# Patient Record
Sex: Male | Born: 2000 | Hispanic: No | Marital: Single | Smoking: Never smoker
Health system: Southern US, Community
[De-identification: ages and names within clinical notes are randomized; demographics above are authoritative.]

## PROBLEM LIST (undated history)

## (undated) ENCOUNTER — Emergency Department (HOSPITAL_COMMUNITY): Payer: Medicaid Other

## (undated) DIAGNOSIS — J45909 Unspecified asthma, uncomplicated: Secondary | ICD-10-CM

---

## 2018-09-22 ENCOUNTER — Emergency Department (HOSPITAL_COMMUNITY)
Admission: EM | Admit: 2018-09-22 | Discharge: 2018-09-22 | Disposition: A | Discharge status: Home / Self Care | Payer: Medicaid Other | Attending: Emergency Medicine | Admitting: Emergency Medicine

## 2018-09-22 ENCOUNTER — Other Ambulatory Visit: Payer: Self-pay

## 2018-09-22 ENCOUNTER — Encounter (HOSPITAL_COMMUNITY): Payer: Self-pay

## 2018-09-22 ENCOUNTER — Emergency Department (HOSPITAL_COMMUNITY): Payer: Medicaid Other

## 2018-09-22 DIAGNOSIS — J45909 Unspecified asthma, uncomplicated: Secondary | ICD-10-CM | POA: Diagnosis not present

## 2018-09-22 DIAGNOSIS — R079 Chest pain, unspecified: Secondary | ICD-10-CM

## 2018-09-22 HISTORY — DX: Unspecified asthma, uncomplicated: J45.909

## 2018-09-22 LAB — TROPONIN I (HIGH SENSITIVITY): Troponin I (High Sensitivity): <2 ng/L (ref <18)

## 2018-09-22 LAB — CBC
HCT: 43.1 % (ref 39.0–52.0)
Hemoglobin: 14.4 g/dL (ref 13.0–17.0)
MCH: 27.5 pg (ref 26.0–34.0)
MCHC: 33.4 g/dL (ref 30.0–36.0)
MCV: 82.3 fL (ref 80.0–100.0)
Platelets: 284 10*3/uL (ref 150–400)
RBC: 5.24 MIL/uL (ref 4.22–5.81)
RDW: 12 % (ref 11.5–15.5)
WBC: 5.6 10*3/uL (ref 4.0–10.5)
nRBC: 0 % (ref 0.0–0.2)

## 2018-09-22 LAB — BASIC METABOLIC PANEL
Anion gap: 9 (ref 5–15)
BUN: 13 mg/dL (ref 6–20)
CO2: 26 mmol/L (ref 22–32)
Calcium: 9.4 mg/dL (ref 8.9–10.3)
Chloride: 104 mmol/L (ref 98–111)
Creatinine, Ser: 0.91 mg/dL (ref 0.61–1.24)
GFR calc Af Amer: >60 mL/min (ref >60)
GFR calc non Af Amer: >60 mL/min (ref >60)
Glucose, Bld: 82 mg/dL (ref 70–99)
Potassium: 4.1 mmol/L (ref 3.5–5.1)
Sodium: 139 mmol/L (ref 135–145)

## 2018-09-22 MED ORDER — SODIUM CHLORIDE 0.9% FLUSH: 3.0000 mL | Freq: Once | INTRAVENOUS | Status: DC

## 2018-09-22 NOTE — Discharge Instructions (Addendum)
Please read attached information. If you experience any new or worsening signs or symptoms please return to the emergency room for evaluation. Please follow-up with your primary care provider or specialist as discussed.  °

## 2018-09-22 NOTE — ED Triage Notes (Signed)
Left sided chest pain that is sharp started last night. Pt states it mostly hurts his chest when he inhales, but sometimes when he exhales. The radiates to the left side of his bottom back. Pt states pain is 8/10. Pt has had migraine since last night. Pt is nsr on monitor. Vs wnl.

## 2018-09-22 NOTE — ED Notes (Signed)
Discharge instructions discussed with Pt. Pt verbalized understanding. Pt stable and ambulatory.    

## 2018-09-22 NOTE — ED Provider Notes (Signed)
[facility] Note   CSN: [id number] Arrival date & time: 09/22/18  1129    History   Chief Complaint Chief Complaint  Patient presents with  . Chest Pain    HPI Gregory Morrison is a 18 y.o. male.     HPI   18 year old male presents today with complaints of chest pain.  He notes he has had intermittent chest pain over the last several days.  He notes he has had this before in the past but did not seek evaluation for this.  He was actually seeing his primary care provider yesterday for foot pain and mention this to her.  She recommended follow-up.  The emergency room today.  Notes the pain is sharp in nature is intermittent and nonpersistent.  He notes sometimes it is worse with deep inspiration and laying down.  He notes that he often does not have symptoms, he is able to exercise without discomfort or pain.  He denies any associated shortness of breath, fever or cough.  He denies any lower extremity swelling or edema, history DVT or PE or any other significant risk factors.  Reports he is a non-smoker.  He notes his mother has a history of heart problems but has never had a heart attack, he denies any personal cardiac or pulmonary histories.  He does not use any drugs.     Past Medical History:  Diagnosis Date  . Asthma     There are no active problems to display for this patient.   Past Surgical History:  Procedure Laterality Date  . TONSILLECTOMY          Home Medications    Prior to Admission medications   Not on File    Family History No family history on file.  Social History Social History   Tobacco Use  . Smoking status: Never Smoker  . Smokeless tobacco: Never Used  Substance Use Topics  . Alcohol use: Never    Frequency: Never  . Drug use: Never     Allergies   Patient has no known allergies.   Review of Systems Review of Systems  All other systems reviewed and are negative.     Physical Exam Updated Vital Signs BP 123/87   Pulse 74   Temp 98.4 F (36.9 C) (Oral)   Resp 17   Ht 5\' 9"  (1.753 m)   Wt 6.35 kg   SpO2 100%   BMI 2.07 kg/m   Physical Exam Vitals signs and nursing note reviewed.  Constitutional:      Appearance: He is well-developed.  HENT:     Head: Normocephalic and atraumatic.  Eyes:     General: No scleral icterus.       Right eye: No discharge.        Left eye: No discharge.     Conjunctiva/sclera: Conjunctivae normal.     Pupils: Pupils are equal, round, and reactive to light.  Neck:     Musculoskeletal: Normal range of motion.     Vascular: No JVD.     Trachea: No tracheal deviation.  Pulmonary:     Effort: Pulmonary effort is normal.     Breath sounds: No stridor.  Neurological:     Mental Status: He is alert and oriented to person, place, and time.     Coordination: Coordination normal.  Psychiatric:        Behavior: Behavior normal.        Thought Content: Thought content  normal.        Judgment: Judgment normal.      ED Treatments / Results  Labs (all labs ordered are listed, but only abnormal results are displayed) Labs Reviewed  BASIC METABOLIC PANEL  CBC  TROPONIN I (HIGH SENSITIVITY)    EKG None   ED EKG normal sinus rhythm no ST elevation or depression, 69 bpm  Radiology Dg Chest 2 View  Result Date: 09/22/2018 CLINICAL DATA:  Shortness of breath and chest pain EXAM: CHEST - 2 VIEW COMPARISON:  November 24, 2017. FINDINGS: Lungs are clear. The heart size and pulmonary vascularity are normal. No adenopathy. No pneumothorax. No bone lesions. IMPRESSION: No edema or consolidation. Electronically Signed   By: Lowella Grip III M.D.   On: 09/22/2018 14:18    Procedures Procedures (including critical care time)  Medications Ordered in ED Medications  sodium chloride flush (NS) 0.9 % injection 3 mL (has no administration in time range)     Initial Impression / Assessment and Plan / ED Course  I  have reviewed the triage vital signs and the nursing notes.  Pertinent labs & imaging results that were available during my care of the patient were reviewed by me and considered in my medical decision making (see chart for details).        Labs: BMP, CBC, troponin  Imaging: DG chest 2 view, ED EKG  Consults:  Therapeutics:  Discharge Meds:   Assessment/Plan: 18 year old male presents today with chest pain.  I have very low suspicion for acute life-threatening etiology including ACS, dissection, PE, infectious etiology.  His work-up is reassuring with negative troponin during EKG.  He has no significant risk factors for pulmonary embolism, he is PERC negative and Wells of 0.  He also has no signs or symptoms consistent with ACS.  Question musculoskeletal chest pain.  Patient will be discharged with encouragement to use ibuprofen as needed, return immediately if develops any new or worsening signs or symptoms.  And follow-up with his primary care if he continues to have recurrence of symptoms.  He verbalized understanding and agreement to today's plan had no further questions or concerns at the time of discharge.   Final Clinical Impressions(s) / ED Diagnoses   Final diagnoses:  Nonspecific chest pain    ED Discharge Orders    None       Francee Gentile 09/22/18 1437    Quintella Reichert, MD 09/23/18 [phone]

## 2019-01-30 ENCOUNTER — Encounter: Payer: Self-pay | Admitting: Cardiology

## 2019-01-30 ENCOUNTER — Other Ambulatory Visit: Payer: Self-pay

## 2019-01-30 ENCOUNTER — Ambulatory Visit (INDEPENDENT_AMBULATORY_CARE_PROVIDER_SITE_OTHER): Payer: Medicaid Other | Admitting: Cardiology

## 2019-01-30 VITALS — BP 104/64 | HR 62 | Ht 70.0 in | Wt 144.0 lb

## 2019-01-30 DIAGNOSIS — R0789 Other chest pain: Secondary | ICD-10-CM

## 2019-01-30 DIAGNOSIS — R079 Chest pain, unspecified: Secondary | ICD-10-CM | POA: Diagnosis not present

## 2019-01-30 DIAGNOSIS — Z1329 Encounter for screening for other suspected endocrine disorder: Secondary | ICD-10-CM

## 2019-01-30 DIAGNOSIS — R002 Palpitations: Secondary | ICD-10-CM

## 2019-01-30 DIAGNOSIS — I10 Essential (primary) hypertension: Secondary | ICD-10-CM | POA: Diagnosis not present

## 2019-01-30 HISTORY — DX: Other chest pain: R07.89

## 2019-01-30 HISTORY — DX: Palpitations: R00.2

## 2019-01-30 LAB — TSH: TSH: 1.98 u[IU]/mL (ref 0.450–4.500)

## 2019-01-30 NOTE — Progress Notes (Signed)
Cardiology Office Note:    Date:  01/30/2019   ID:  Gregory Morrison, DOB June 20, 2000, MRN [id number]  PCP:  Marnette Burgess, MD  Cardiologist:  Jenean Lindau, MD   Referring MD: No ref. provider found    ASSESSMENT:    1. Chest pain, unspecified type   2. Essential hypertension   3. Palpitations   4. Chest discomfort    PLAN:    In order of problems listed above:  1. Chest discomfort: This was atypical.  He is a very active gentleman.  With activity he denies any significant issues.  He is sexually active  and with sexual activity has not brought about any chest discomfort-like symptoms. 2. Palpitations: He will undergo 2-week ZIO monitor and 2 wk zio evaluation. 3. Echocardiogram will be done to assess murmur heard on auscultation. 4. Patient will be seen in follow-up appointment in 3 months or earlier if the patient has any concerns    Medication Adjustments/Labs and Tests Ordered: Current medicines are reviewed at length with the patient today.  Concerns regarding medicines are outlined above.  Orders Placed This Encounter  Procedures  . LONG TERM MONITOR (3-14 DAYS)  . EKG 12-Lead  . ECHOCARDIOGRAM COMPLETE   No orders of the defined types were placed in this encounter.    History of Present Illness:    Gregory Morrison is a 18 y.o. male who is being seen today for the evaluation of chest discomfort and palpitations.  Patient is a pleasant 18 year old male.  He has no significant past medical history.  He went to the emergency room with some chest discomfort-like symptoms he was evaluated treated and released.  Subsequently is done fine.  His EKG reveals sinus rhythm that we did today.  He denies any dizziness or any syncopal spells.  He is overall in good health.  He has been an active gentleman.  At the time of my evaluation, the patient is alert awake oriented and in no distress.  Past Medical History:  Diagnosis Date  . Asthma     Past Surgical  History:  Procedure Laterality Date  . TONSILLECTOMY      Current Medications: No outpatient medications have been marked as taking for the 01/30/19 encounter (Office Visit) with Marc Leichter, Reita Cliche, MD.     Allergies:   Patient has no known allergies.   Social History   Socioeconomic History  . Marital status: Single    Spouse name: Not on file  . Number of children: Not on file  . Years of education: Not on file  . Highest education level: Not on file  Occupational History  . Not on file  Social Needs  . Financial resource strain: Not on file  . Food insecurity    Worry: Not on file    Inability: Not on file  . Transportation needs    Medical: Not on file    Non-medical: Not on file  Tobacco Use  . Smoking status: Never Smoker  . Smokeless tobacco: Never Used  Substance and Sexual Activity  . Alcohol use: Never    Frequency: Never  . Drug use: Never  . Sexual activity: Not on file  Lifestyle  . Physical activity    Days per week: Not on file    Minutes per session: Not on file  . Stress: Not on file  Relationships  . Social Herbalist on phone: Not on file    Gets together: Not on file  Attends religious service: Not on file    Active member of club or organization: Not on file    Attends meetings of clubs or organizations: Not on file    Relationship status: Not on file  Other Topics Concern  . Not on file  Social History Narrative  . Not on file     Family History: The patient's family history includes Dementia in his maternal grandfather; Diabetes in his father and mother; Hypertension in his father and mother.  ROS:   Please see the history of present illness.    All other systems reviewed and are negative.  EKGs/Labs/Other Studies Reviewed:    The following studies were reviewed today: EKG reveals sinus rhythm and nonspecific ST-T changes.   Recent Labs: 09/22/2018: BUN 13; Creatinine, Ser 0.91; Hemoglobin 14.4; Platelets 284;  Potassium 4.1; Sodium 139  Recent Lipid Panel No results found for: CHOL, TRIG, HDL, CHOLHDL, VLDL, LDLCALC, LDLDIRECT  Physical Exam:    VS:  BP 104/64 (BP Location: Left Arm, Patient Position: Sitting, Cuff Size: Normal)   Pulse 62   Ht 5\' 10"  (1.778 m)   Wt 144 lb (65.3 kg)   SpO2 98%   BMI 20.66 kg/m     Wt Readings from Last 3 Encounters:  01/30/19 144 lb (65.3 kg)  09/22/18 14 lb (6.35 kg) (<1 %, Z= -56.12)*   * Growth percentiles are based on CDC (Boys, 2-20 Years) data.     GEN: Patient is in no acute distress HEENT: Normal NECK: No JVD; No carotid bruits LYMPHATICS: No lymphadenopathy CARDIAC: S1 S2 regular, 2/6 systolic murmur at the apex. RESPIRATORY:  Clear to auscultation without rales, wheezing or rhonchi  ABDOMEN: Soft, non-tender, non-distended MUSCULOSKELETAL:  No edema; No deformity  SKIN: Warm and dry NEUROLOGIC:  Alert and oriented x 3 PSYCHIATRIC:  Normal affect    Signed, 09/24/18, MD  01/30/2019 10:29 AM    [facility]

## 2019-01-30 NOTE — Patient Instructions (Addendum)
Medication Instructions:  Your physician recommends that you continue on your current medications as directed. Please refer to the Current Medication list given to you today.  *If you need a refill on your cardiac medications before your next appointment, please call your pharmacy*  Lab Work: Your physician recommends that you have a TSH drawn today If you have labs (blood work) drawn today and your tests are completely normal, you will receive your results only by: Marland Kitchen MyChart Message (if you have MyChart) OR . A paper copy in the mail If you have any lab test that is abnormal or we need to change your treatment, we will call you to review the results.  Testing/Procedures: You had an EKG performed today  Your physician has requested that you have an echocardiogram. Echocardiography is a painless test that uses sound waves to create images of your heart. It provides your doctor with information about the size and shape of your heart and how well your heart's chambers and valves are working. This procedure takes approximately one hour. There are no restrictions for this procedure.  Your physician has recommended that you wear a ZIO monitor. ZIO monitors are medical devices that record the heart's electrical activity. Doctors most often use these monitors to diagnose arrhythmias. Arrhythmias are problems with the speed or rhythm of the heartbeat. The monitor is a small, portable device. You can wear one while you do your normal daily activities. This is usually used to diagnose what is causing palpitations/syncope (passing out).You will wear this device for 2 weeks    Follow-Up: At [facility], you and your health needs are our priority.  As part of our continuing mission to provide you with exceptional heart care, we have created designated Provider Care Teams.  These Care Teams include your primary Cardiologist (physician) and Advanced Practice Providers (APPs -  Physician Assistants and Nurse  Practitioners) who all work together to provide you with the care you need, when you need it.  Your next appointment:   3 months  The format for your next appointment:   In Person  Provider:   Belva Crome, MD  Other Instructions  Echocardiogram An echocardiogram is a procedure that uses painless sound waves (ultrasound) to produce an image of the heart. Images from an echocardiogram can provide important information about:  Signs of coronary artery disease (CAD).  Aneurysm detection. An aneurysm is a weak or damaged part of an artery wall that bulges out from the normal force of blood pumping through the body.  Heart size and shape. Changes in the size or shape of the heart can be associated with certain conditions, including heart failure, aneurysm, and CAD.  Heart muscle function.  Heart valve function.  Signs of a past heart attack.  Fluid buildup around the heart.  Thickening of the heart muscle.  A tumor or infectious growth around the heart valves. Tell a health care provider about:  Any allergies you have.  All medicines you are taking, including vitamins, herbs, eye drops, creams, and over-the-counter medicines.  Any blood disorders you have.  Any surgeries you have had.  Any medical conditions you have.  Whether you are pregnant or may be pregnant. What are the risks? Generally, this is a safe procedure. However, problems may occur, including:  Allergic reaction to dye (contrast) that may be used during the procedure. What happens before the procedure? No specific preparation is needed. You may eat and drink normally. What happens during the procedure?  An IV tube may be inserted into one of your veins.  You may receive contrast through this tube. A contrast is an injection that improves the quality of the pictures from your heart.  A gel will be applied to your chest.  A wand-like tool (transducer) will be moved over your chest. The gel will  help to transmit the sound waves from the transducer.  The sound waves will harmlessly bounce off of your heart to allow the heart images to be captured in real-time motion. The images will be recorded on a computer. The procedure may vary among health care providers and hospitals. What happens after the procedure?  You may return to your normal, everyday life, including diet, activities, and medicines, unless your health care provider tells you not to do that. Summary  An echocardiogram is a procedure that uses painless sound waves (ultrasound) to produce an image of the heart.  Images from an echocardiogram can provide important information about the size and shape of your heart, heart muscle function, heart valve function, and fluid buildup around your heart.  You do not need to do anything to prepare before this procedure. You may eat and drink normally.  After the echocardiogram is completed, you may return to your normal, everyday life, unless your health care provider tells you not to do that. This information is not intended to replace advice given to you by your health care provider. Make sure you discuss any questions you have with your health care provider. Document Released: 03/13/2000 Document Revised: 07/07/2018 Document Reviewed: 04/18/2016 Elsevier Patient Education  2020 [employer].

## 2019-02-01 ENCOUNTER — Telehealth: Payer: Self-pay

## 2019-02-01 NOTE — Telephone Encounter (Signed)
Results relayed, copy sent to Dr. Beckie Salts

## 2019-02-01 NOTE — Telephone Encounter (Signed)
-----   Message from Jenean Lindau, MD sent at 01/30/2019  4:52 PM EST ----- The results of the study is unremarkable. Please inform patient. I will discuss in detail at next appointment. Cc  primary care/referring physician Jenean Lindau, MD 01/30/2019 4:52 PM

## 2019-03-14 ENCOUNTER — Other Ambulatory Visit: Payer: Medicaid Other

## 2019-05-08 ENCOUNTER — Ambulatory Visit (INDEPENDENT_AMBULATORY_CARE_PROVIDER_SITE_OTHER): Payer: Medicaid Other | Admitting: Cardiology

## 2019-05-08 ENCOUNTER — Encounter: Payer: Self-pay | Admitting: Cardiology

## 2019-05-08 ENCOUNTER — Other Ambulatory Visit: Payer: Self-pay

## 2019-05-08 ENCOUNTER — Ambulatory Visit (INDEPENDENT_AMBULATORY_CARE_PROVIDER_SITE_OTHER): Payer: Medicaid Other

## 2019-05-08 VITALS — BP 108/62 | HR 88 | Ht 70.0 in | Wt 139.0 lb

## 2019-05-08 DIAGNOSIS — R002 Palpitations: Secondary | ICD-10-CM

## 2019-05-08 DIAGNOSIS — R079 Chest pain, unspecified: Secondary | ICD-10-CM

## 2019-05-08 NOTE — Patient Instructions (Signed)
Medication Instructions:  No medication changes *If you need a refill on your cardiac medications before your next appointment, please call your pharmacy*  Lab Work: None ordered If you have labs (blood work) drawn today and your tests are completely normal, you will receive your results only by: . MyChart Message (if you have MyChart) OR . A paper copy in the mail If you have any lab test that is abnormal or we need to change your treatment, we will call you to review the results.  Testing/Procedures: None ordered  Follow-Up: At [facility], you and your health needs are our priority.  As part of our continuing mission to provide you with exceptional heart care, we have created designated Provider Care Teams.  These Care Teams include your primary Cardiologist (physician) and Advanced Practice Providers (APPs -  Physician Assistants and Nurse Practitioners) who all work together to provide you with the care you need, when you need it.  Your next appointment:   3 month(s)  The format for your next appointment:   In Person  Provider:   Rajan Revankar, MD  Other Instructions NA  

## 2019-05-08 NOTE — Progress Notes (Signed)
Cardiology Office Note:    Date:  05/08/2019   ID:  Gregory Morrison, DOB [birth year], MRN [id number]  PCP:  [facility]  Cardiologist:  Garwin Brothers, MD   Referring MD: Eula Fried, MD    ASSESSMENT:    1. Palpitations    PLAN:    In order of problems listed above:  1. Palpitations: These have resolved for most part.  We are awaiting results of the monitoring. 2. Echocardiogram was within normal limits and I discussed this with the patient.  His chest discomfort has resolved asked him to start exercising some on a regular basis in a graded fashion and he promises to do so.  I told him to walk about half an hour daily and see how he feels and get back to [place] about it. Patient will be seen in follow-up appointment in 3 months or earlier if the patient has any concerns    Medication Adjustments/Labs and Tests Ordered: Current medicines are reviewed at length with the patient today.  Concerns regarding medicines are outlined above.  No orders of the defined types were placed in this encounter.  No orders of the defined types were placed in this encounter.    Chief Complaint  Patient presents with  . Follow-up    3 Months     History of Present Illness:    Gregory Morrison is a 19 y.o. male.  Patient was evaluated by me for palpitations and some chest discomfort.  He mentions to me that his chest discomfort has completely resolved.  He denies any chest pain orthopnea or PND.  He is palpitations have also resolved.  He experiences them very occasionally.  He has returned his event monitor and event monitor company mentioned to him that they have not received it.  We are going to look into this.  Past Medical History:  Diagnosis Date  . Asthma     Past Surgical History:  Procedure Laterality Date  . TONSILLECTOMY      Current Medications: Current Meds  Medication Sig  . Hyoscyamine Sulfate (LEVSIN PO) Take by mouth.     Allergies:   Patient has no known allergies.   Social History   Socioeconomic History  . Marital status: Single    Spouse name: Not on file  . Number of children: Not on file  . Years of education: Not on file  . Highest education level: Not on file  Occupational History  . Not on file  Tobacco Use  . Smoking status: Never Smoker  . Smokeless tobacco: Never Used  Substance and Sexual Activity  . Alcohol use: Never  . Drug use: Never  . Sexual activity: Not on file  Other Topics Concern  . Not on file  Social History Narrative  . Not on file   Social Determinants of Health   Financial Resource Strain:   . Difficulty of Paying Living Expenses: Not on file  Food Insecurity:   . Worried About Programme researcher, broadcasting/film/video in the Last Year: Not on file  . Ran Out of Food in the Last Year: Not on file  Transportation Needs:   . Lack of Transportation (Medical): Not on file  . Lack of Transportation (Non-Medical): Not on file  Physical Activity:   . Days of Exercise per Week: Not on file  . Minutes of Exercise per Session: Not on file  Stress:   . Feeling of Stress : Not on file  Social Connections:   . Frequency of Communication with Friends and Family: Not on file  . Frequency of Social Gatherings with Friends and Family: Not on file  . Attends Religious Services: Not on file  . Active Member of Clubs or Organizations: Not on file  . Attends Archivist Meetings: Not on file  . Marital Status: Not on file     Family History: The patient's family history includes Dementia in his maternal grandfather; Diabetes in his father and mother; Hypertension in his father and mother.  ROS:   Please see the history of present illness.    All other systems reviewed and are negative.  EKGs/Labs/Other Studies Reviewed:    The following studies were reviewed today: Echocardiogram revealed normal systolic function and was otherwise unremarkable study   Recent  Labs: 09/22/2018: BUN 13; Creatinine, Ser 0.91; Hemoglobin 14.4; Platelets 284; Potassium 4.1; Sodium 139 01/30/2019: TSH 1.980  Recent Lipid Panel No results found for: CHOL, TRIG, HDL, CHOLHDL, VLDL, LDLCALC, LDLDIRECT  Physical Exam:    VS:  BP 108/62   Pulse 88   Ht 5\' 10"  (1.778 m)   Wt 139 lb (63 kg)   SpO2 98%   BMI 19.94 kg/m     Wt Readings from Last 3 Encounters:  05/08/19 139 lb (63 kg)  01/30/19 144 lb (65.3 kg)  09/22/18 14 lb (6.35 kg) (<1 %, Z= -56.12)*   * Growth percentiles are based on CDC (Boys, 2-20 Years) data.     GEN: Patient is in no acute distress HEENT: Normal NECK: No JVD; No carotid bruits LYMPHATICS: No lymphadenopathy CARDIAC: Hear sounds regular, 2/6 systolic murmur at the apex. RESPIRATORY:  Clear to auscultation without rales, wheezing or rhonchi  ABDOMEN: Soft, non-tender, non-distended MUSCULOSKELETAL:  No edema; No deformity  SKIN: Warm and dry NEUROLOGIC:  Alert and oriented x 3 PSYCHIATRIC:  Normal affect   Signed, Jenean Lindau, MD  05/08/2019 2:42 PM    [facility]

## 2019-05-08 NOTE — Progress Notes (Signed)
Complete echocardiogram has been performed.  Jimmy Jameila Keeny RDCS, RVT 

## 2019-06-22 ENCOUNTER — Emergency Department (HOSPITAL_COMMUNITY)
Admission: EM | Admit: 2019-06-22 | Discharge: 2019-06-22 | Discharge status: Against Medical Advice | Payer: Medicaid Other

## 2019-06-22 NOTE — ED Notes (Signed)
Called for triage, no answer

## 2019-08-03 ENCOUNTER — Other Ambulatory Visit: Payer: Self-pay

## 2019-08-07 ENCOUNTER — Ambulatory Visit: Payer: Medicaid Other | Admitting: Cardiology

## 2019-08-15 ENCOUNTER — Ambulatory Visit (INDEPENDENT_AMBULATORY_CARE_PROVIDER_SITE_OTHER): Payer: Medicaid Other | Admitting: Cardiology

## 2019-08-15 ENCOUNTER — Other Ambulatory Visit: Payer: Self-pay

## 2019-08-15 ENCOUNTER — Encounter: Payer: Self-pay | Admitting: Cardiology

## 2019-08-15 VITALS — BP 108/62 | HR 88 | Ht 70.0 in | Wt 141.0 lb

## 2019-08-15 DIAGNOSIS — R002 Palpitations: Secondary | ICD-10-CM

## 2019-08-15 NOTE — Progress Notes (Signed)
Cardiology Office Note:    Date:  08/15/2019   ID:  Gregory Morrison, DOB [birth year], MRN [id number]  PCP:  [facility]  Cardiologist:  Garwin Brothers, MD   Referring MD: [facility]    ASSESSMENT:    1. Palpitations    PLAN:    In order of problems listed above:  1. Palpitations: I reassured the patient about my findings.  He has not had any serious symptoms such as syncope or dizziness from these.  He is concerned about it.  TSH done in November was unremarkable so was echocardiogram done in the past and I reviewed this with him at length.  I reassured him.  I will do a 2-week event monitor to understand his symptoms.  He is agreeable for this.  He knows to go to nearest emergency room for any concerning symptoms.Patient will be seen in follow-up appointment in 2 months or earlier if the patient has any concerns    Medication Adjustments/Labs and Tests Ordered: Current medicines are reviewed at length with the patient today.  Concerns regarding medicines are outlined above.  No orders of the defined types were placed in this encounter.  No orders of the defined types were placed in this encounter.    Chief Complaint  Patient presents with  . Follow-up     History of Present Illness:    Gregory Morrison is a 19 y.o. male.  Patient has been evaluated in the past for palpitations.  He denies any problems at this time except occasional palpitations which happen 3-4 times a week.  No orthopnea PND dizziness or any syncope.  At the time of my evaluation, the patient is alert awake oriented and in no distress.  Past Medical History:  Diagnosis Date  . Asthma   . Chest discomfort 01/30/2019  . Palpitations 01/30/2019    Past Surgical History:  Procedure Laterality Date  . TONSILLECTOMY      Current Medications: No outpatient medications have been marked as taking for the 08/15/19 encounter (Office Visit) with Lachae Hohler,  Aundra Dubin, MD.     Allergies:   Patient has no known allergies.   Social History   Socioeconomic History  . Marital status: Single    Spouse name: Not on file  . Number of children: Not on file  . Years of education: Not on file  . Highest education level: Not on file  Occupational History  . Not on file  Tobacco Use  . Smoking status: Never Smoker  . Smokeless tobacco: Never Used  Substance and Sexual Activity  . Alcohol use: Never  . Drug use: Never  . Sexual activity: Not on file  Other Topics Concern  . Not on file  Social History Narrative  . Not on file   Social Determinants of Health   Financial Resource Strain:   . Difficulty of Paying Living Expenses:   Food Insecurity:   . Worried About Programme researcher, broadcasting/film/video in the Last Year:   . Barista in the Last Year:   Transportation Needs:   . Freight forwarder (Medical):   Marland Kitchen Lack of Transportation (Non-Medical):   Physical Activity:   . Days of Exercise per Week:   . Minutes of Exercise per Session:   Stress:   . Feeling of Stress :   Social Connections:   . Frequency of Communication with Friends and Family:   . Frequency of Social Gatherings with  Friends and Family:   . Attends Religious Services:   . Active Member of Clubs or Organizations:   . Attends Archivist Meetings:   Marland Kitchen Marital Status:      Family History: The patient's family history includes Dementia in his maternal grandfather; Diabetes in his father and mother; Hypertension in his father and mother.  ROS:   Please see the history of present illness.    All other systems reviewed and are negative.  EKGs/Labs/Other Studies Reviewed:    The following studies were reviewed today: IMPRESSIONS    1. Left ventricular ejection fraction, by estimation, is 60 to 65%. The  left ventricle has normal function.   Recent Labs: 09/22/2018: BUN 13; Creatinine, Ser 0.91; Hemoglobin 14.4; Platelets 284; Potassium 4.1; Sodium  139 01/30/2019: TSH 1.980  Recent Lipid Panel No results found for: CHOL, TRIG, HDL, CHOLHDL, VLDL, LDLCALC, LDLDIRECT  Physical Exam:    VS:  BP 108/62   Pulse 88   Ht 5\' 10"  (1.778 m)   Wt 141 lb (64 kg)   SpO2 98%   BMI 20.23 kg/m     Wt Readings from Last 3 Encounters:  08/15/19 141 lb (64 kg)  05/08/19 139 lb (63 kg)  01/30/19 144 lb (65.3 kg)     GEN: Patient is in no acute distress HEENT: Normal NECK: No JVD; No carotid bruits LYMPHATICS: No lymphadenopathy CARDIAC: Hear sounds regular, 2/6 systolic murmur at the apex. RESPIRATORY:  Clear to auscultation without rales, wheezing or rhonchi  ABDOMEN: Soft, non-tender, non-distended MUSCULOSKELETAL:  No edema; No deformity  SKIN: Warm and dry NEUROLOGIC:  Alert and oriented x 3 PSYCHIATRIC:  Normal affect   Signed, Jenean Lindau, MD  08/15/2019 3:27 PM    [facility]

## 2019-08-15 NOTE — Patient Instructions (Signed)
Medication Instructions:  No medication changes. *If you need a refill on your cardiac medications before your next appointment, please call your pharmacy*   Lab Work: None ordered If you have labs (blood work) drawn today and your tests are completely normal, you will receive your results only by: Marland Kitchen MyChart Message (if you have MyChart) OR . A paper copy in the mail If you have any lab test that is abnormal or we need to change your treatment, we will call you to review the results.   Testing/Procedures:  WHY IS MY DOCTOR PRESCRIBING ZIO? The Zio system is proven and trusted by physicians to detect and diagnose irregular heart rhythms -- and has been prescribed to hundreds of thousands of patients.  The FDA has cleared the Zio system to monitor for many different kinds of irregular heart rhythms. In a study, physicians were able to reach a diagnosis 90% of the time with the Zio system1.  You can wear the Zio monitor -- a small, discreet, comfortable patch -- during your normal day-to-day activity, including while you sleep, shower, and exercise, while it records every single heartbeat for analysis.  1Barrett, P., et al. Comparison of 24 Hour Holter Monitoring Versus 14 Day Novel Adhesive Patch Electrocardiographic Monitoring. American Journal of Medicine, 2014.  ZIO VS. HOLTER MONITORING The Zio monitor can be comfortably worn for up to 14 days. Holter monitors can be worn for 24 to 48 hours, limiting the time to record any irregular heart rhythms you may have. Zio is able to capture data for the 51% of patients who have their first symptom-triggered arrhythmia after 48 hours.1  LIVE WITHOUT RESTRICTIONS The Zio ambulatory cardiac monitor is a small, unobtrusive, and water-resistant patch--you might even forget you're wearing it. The Zio monitor records and stores every beat of your heart, whether you're sleeping, working out, or showering.  Wear the monitor for 2 weeks,, remove on  08/29/19.   Follow-Up: At [facility], you and your health needs are our priority.  As part of our continuing mission to provide you with exceptional heart care, we have created designated Provider Care Teams.  These Care Teams include your primary Cardiologist (physician) and Advanced Practice Providers (APPs -  Physician Assistants and Nurse Practitioners) who all work together to provide you with the care you need, when you need it.  We recommend signing up for the patient portal called "MyChart".  Sign up information is provided on this After Visit Summary.  MyChart is used to connect with patients for Virtual Visits (Telemedicine).  Patients are able to view lab/test results, encounter notes, upcoming appointments, etc.  Non-urgent messages can be sent to your provider as well.   To learn more about what you can do with MyChart, go to [email or web address].    Your next appointment:   6 month(s)  The format for your next appointment:   In Person  Provider:   Belva Crome, MD   Other Instructions NA

## 2020-06-04 DIAGNOSIS — J45909 Unspecified asthma, uncomplicated: Secondary | ICD-10-CM | POA: Insufficient documentation

## 2020-06-05 ENCOUNTER — Encounter: Payer: Self-pay | Admitting: Cardiology

## 2020-06-05 ENCOUNTER — Ambulatory Visit (INDEPENDENT_AMBULATORY_CARE_PROVIDER_SITE_OTHER): Payer: Medicaid Other | Admitting: Cardiology

## 2020-06-05 ENCOUNTER — Other Ambulatory Visit: Payer: Self-pay

## 2020-06-05 VITALS — BP 124/82 | HR 90 | Ht 69.0 in | Wt 152.6 lb

## 2020-06-05 DIAGNOSIS — R0789 Other chest pain: Secondary | ICD-10-CM | POA: Diagnosis not present

## 2020-06-05 DIAGNOSIS — R002 Palpitations: Secondary | ICD-10-CM

## 2020-06-05 NOTE — Patient Instructions (Signed)
Medication Instructions:  No medication changes. *If you need a refill on your cardiac medications before your next appointment, please call your pharmacy*   Lab Work: None ordered If you have labs (blood work) drawn today and your tests are completely normal, you will receive your results only by: Marland Kitchen MyChart Message (if you have MyChart) OR . A paper copy in the mail If you have any lab test that is abnormal or we need to change your treatment, we will call you to review the results.   Testing/Procedures: Your physician has requested that you have a stress echocardiogram. For further information please visit [email or web address] Please follow instruction sheet as given.  This will be done at [facility].  You must have a COVID test prior to the stress test.   Follow-Up: At [facility], you and your health needs are our priority.  As part of our continuing mission to provide you with exceptional heart care, we have created designated Provider Care Teams.  These Care Teams include your primary Cardiologist (physician) and Advanced Practice Providers (APPs -  Physician Assistants and Nurse Practitioners) who all work together to provide you with the care you need, when you need it.  We recommend signing up for the patient portal called "MyChart".  Sign up information is provided on this After Visit Summary.  MyChart is used to connect with patients for Virtual Visits (Telemedicine).  Patients are able to view lab/test results, encounter notes, upcoming appointments, etc.  Non-urgent messages can be sent to your provider as well.   To learn more about what you can do with MyChart, go to [email or web address].    Your next appointment:   6 month(s)  The format for your next appointment:   In Person  Provider:   Belva Crome, MD   Other Instructions  Exercise Stress Echocardiogram An exercise stress echocardiogram is a test to check how well your heart is working. This  test uses sound waves and a computer to make pictures of your heart. These pictures will be taken before and after you exercise. For this test, you will walk on a treadmill or ride a bicycle to make your heart beat faster. While you exercise, your heart will be checked with an electrocardiogram (ECG). Your blood pressure will also be checked. You may have this test if:  You have chest pain or a heart problem.  You had a heart attack or heart surgery not long ago.  You have heart valve problems.  You have a condition that causes narrowing of the blood vessels that supply your heart.  You have a high risk of heart disease and: ? You are starting a new exercise program. ? You need to have a big surgery. Tell a doctor about:  Any allergies you have.  All medicines you are taking. This includes vitamins, herbs, eye drops, creams, and over-the-counter medicines.  Any problems you or family members have had with medicines that make you fall asleep (anesthetic medicines).  Any surgeries you have had.  Any blood disorders you have.  Any medical conditions you have.  Whether you are pregnant or may be pregnant. What are the risks? Generally, this is a safe test. However, problems may occur, including:  Chest pain.  Feeling dizzy or light-headed.  Shortness of breath.  Increased or irregular heartbeat.  Feeling like you may vomit (nausea) or vomiting.  Heart attack. This is very rare. What happens before the test? Medicines  Ask your doctor about changing or stopping  your normal medicines. This is important if you take diabetes medicines or blood thinners.  If you use an inhaler, bring it to the test. General instructions  Wear comfortable clothes and walking shoes.  Follow instructions from your doctor about what you cannot eat or drink before the test.  Do not drink or eat anything that has caffeine in it. Stop having caffeine 24 hours before the test.  Do not smoke  or use products that contain nicotine or tobacco for 4 hours before the test. If you need help quitting, ask your doctor. What happens during the test?  You will take off your clothes from the waist up and put on a hospital gown.  Electrodes or patches will be put on your chest.  A blood pressure cuff will be put on your arm.  Before you exercise, a computer will make a picture of your heart. To do this: ? You will lie down and a gel will be put on your chest. ? A wand will be moved over the gel. ? Sound waves from the wand will go to the computer to make the picture.  Then, you will start to exercise. You may walk on a treadmill or pedal a bicycle.  Your blood pressure and heart rhythm will be checked while you exercise.  The exercise will get harder or faster.  You will exercise until: ? Your heart reaches a certain level. ? You are too tired to go on. ? You cannot go on because of chest pain, weakness, or dizziness.  You will lie down right away so another picture of your heart can be taken. The procedure may vary among doctors and hospitals.   What can I expect after the test?  After your test, it is common to have: ? Mild soreness. ? Mild tiredness. Your heart rate and blood pressure will be checked until they return to your normal levels. You should not have any new symptoms after this test. Follow these instructions at home:  If your doctor says that you can, you may: ? Eat what you normally eat. ? Do your normal activities.  Take over-the-counter and prescription medicines only as told by your doctor.  Keep all follow-up visits.  It is up to you to get the results of your test. Ask how to get your results when they are ready. Contact a doctor if:  You feel dizzy or light-headed.  You have a fast or irregular heartbeat.  You feel like you may vomit or you vomit.  You have a headache.  You feel short of breath. Get help right away if:  You develop pain  or pressure: ? In your chest. ? In your jaw or neck. ? Between your shoulders. ? That goes down your left arm.  You faint.  You have trouble breathing. These symptoms may be an emergency. Get medical help right away. Call your local emergency services (911 in the [place]  Do not wait to see if the symptoms will go away.  Do not drive yourself to the hospital. Summary  This is a test that checks how well your heart is working.  Follow instructions about what you cannot eat or drink before the test. Ask your doctor if you should take your normal medicines before the test.  Stop having caffeine 24 hours before the test.  Do not smoke or use products with nicotine or tobacco in them for 4 hours before the test.  During the test, your blood pressure and  heart rhythm will be checked while you exercise. This information is not intended to replace advice given to you by your health care provider. Make sure you discuss any questions you have with your health care provider. Document Revised: 11/07/2019 Document Reviewed: 11/07/2019 Elsevier Patient Education  2021 [employer].

## 2020-06-05 NOTE — Progress Notes (Signed)
Cardiology Office Note:    Date:  06/05/2020   ID:  Gregory Morrison, DOB May 04, 2000, MRN [id number]  PCP:  [facility]  Cardiologist:  Garwin Brothers, MD   Referring MD: [facility]    ASSESSMENT:    1. Palpitations   2. Chest discomfort    PLAN:    In order of problems listed above:  1. Primary prevention stressed with the patient.  Importance of compliance with diet medication stressed any vocalized understanding. 2. Chest pain: He exercises on a regular basis 2-3 times a week.  However his symptoms of chest pain are concerning him and he is a little anxious about it.  In view.  We will do an exercise stress echo to evaluate his symptoms and to reassure him.  He is agreeable. 3. Patient will be seen in follow-up appointment in 6 months or earlier if the patient has any concerns.  He knows to go to the nearest emergency room for any concerning symptoms.   Medication Adjustments/Labs and Tests Ordered: Current medicines are reviewed at length with the patient today.  Concerns regarding medicines are outlined above.  Orders Placed This Encounter  Procedures  . EKG 12-Lead  . ECHOCARDIOGRAM STRESS TEST   No orders of the defined types were placed in this encounter.    No chief complaint on file.    History of Present Illness:    Gregory Morrison is a 20 y.o. male.  Patient is here for follow-up.  He mentions to me that his symptoms have subsided.  He has no palpitations.  He is concerned about chest pain.  He mentions to me that he feels some chest tightness and anterior part of the chest.  No orthopnea or PND.  He is concerned about it.  This does not occur with exertion.  This is got him worried.  At the time of my evaluation, the patient is alert awake oriented and in no distress.  Past Medical History:  Diagnosis Date  . Asthma   . Chest discomfort 01/30/2019  . Palpitations 01/30/2019    Past Surgical History:   Procedure Laterality Date  . TONSILLECTOMY      Current Medications: Current Meds  Medication Sig  . meloxicam (MOBIC) 15 MG tablet Take 15 mg by mouth daily.     Allergies:   Patient has no known allergies.   Social History   Socioeconomic History  . Marital status: Single    Spouse name: Not on file  . Number of children: Not on file  . Years of education: Not on file  . Highest education level: Not on file  Occupational History  . Not on file  Tobacco Use  . Smoking status: Never Smoker  . Smokeless tobacco: Never Used  Vaping Use  . Vaping Use: Never used  Substance and Sexual Activity  . Alcohol use: Never  . Drug use: Never  . Sexual activity: Not on file  Other Topics Concern  . Not on file  Social History Narrative  . Not on file   Social Determinants of Health   Financial Resource Strain: Not on file  Food Insecurity: Not on file  Transportation Needs: Not on file  Physical Activity: Not on file  Stress: Not on file  Social Connections: Not on file     Family History: The patient's family history includes Dementia in his maternal grandfather; Diabetes in his father and mother; Hypertension in his father and  mother.  ROS:   Please see the history of present illness.    All other systems reviewed and are negative.  EKGs/Labs/Other Studies Reviewed:    The following studies were reviewed today: IMPRESSIONS    1. Left ventricular ejection fraction, by estimation, is 60 to 65%. The  left ventricle has normal function.    Recent Labs: No results found for requested labs within last 8760 hours.  Recent Lipid Panel No results found for: CHOL, TRIG, HDL, CHOLHDL, VLDL, LDLCALC, LDLDIRECT  Physical Exam:    VS:  BP 124/82   Pulse 90   Ht 5\' 9"  (1.753 m)   Wt 152 lb 9.6 oz (69.2 kg)   SpO2 99%   BMI 22.54 kg/m     Wt Readings from Last 3 Encounters:  06/05/20 152 lb 9.6 oz (69.2 kg)  08/15/19 141 lb (64 kg)  05/08/19 139 lb (63 kg)      GEN: Patient is in no acute distress HEENT: Normal NECK: No JVD; No carotid bruits LYMPHATICS: No lymphadenopathy CARDIAC: Hear sounds regular, 2/6 systolic murmur at the apex. RESPIRATORY:  Clear to auscultation without rales, wheezing or rhonchi  ABDOMEN: Soft, non-tender, non-distended MUSCULOSKELETAL:  No edema; No deformity  SKIN: Warm and dry NEUROLOGIC:  Alert and oriented x 3 PSYCHIATRIC:  Normal affect   Signed, 07/06/19, MD  06/05/2020 4:39 PM    [facility]

## 2020-10-21 IMAGING — DX CHEST - 2 VIEW
2 series · 2 of 2 positions shown · non-contrast
Comparison: November 24, 2017.

CLINICAL DATA: Shortness of breath and chest pain

EXAM:
CHEST - 2 VIEW

[chest pa]
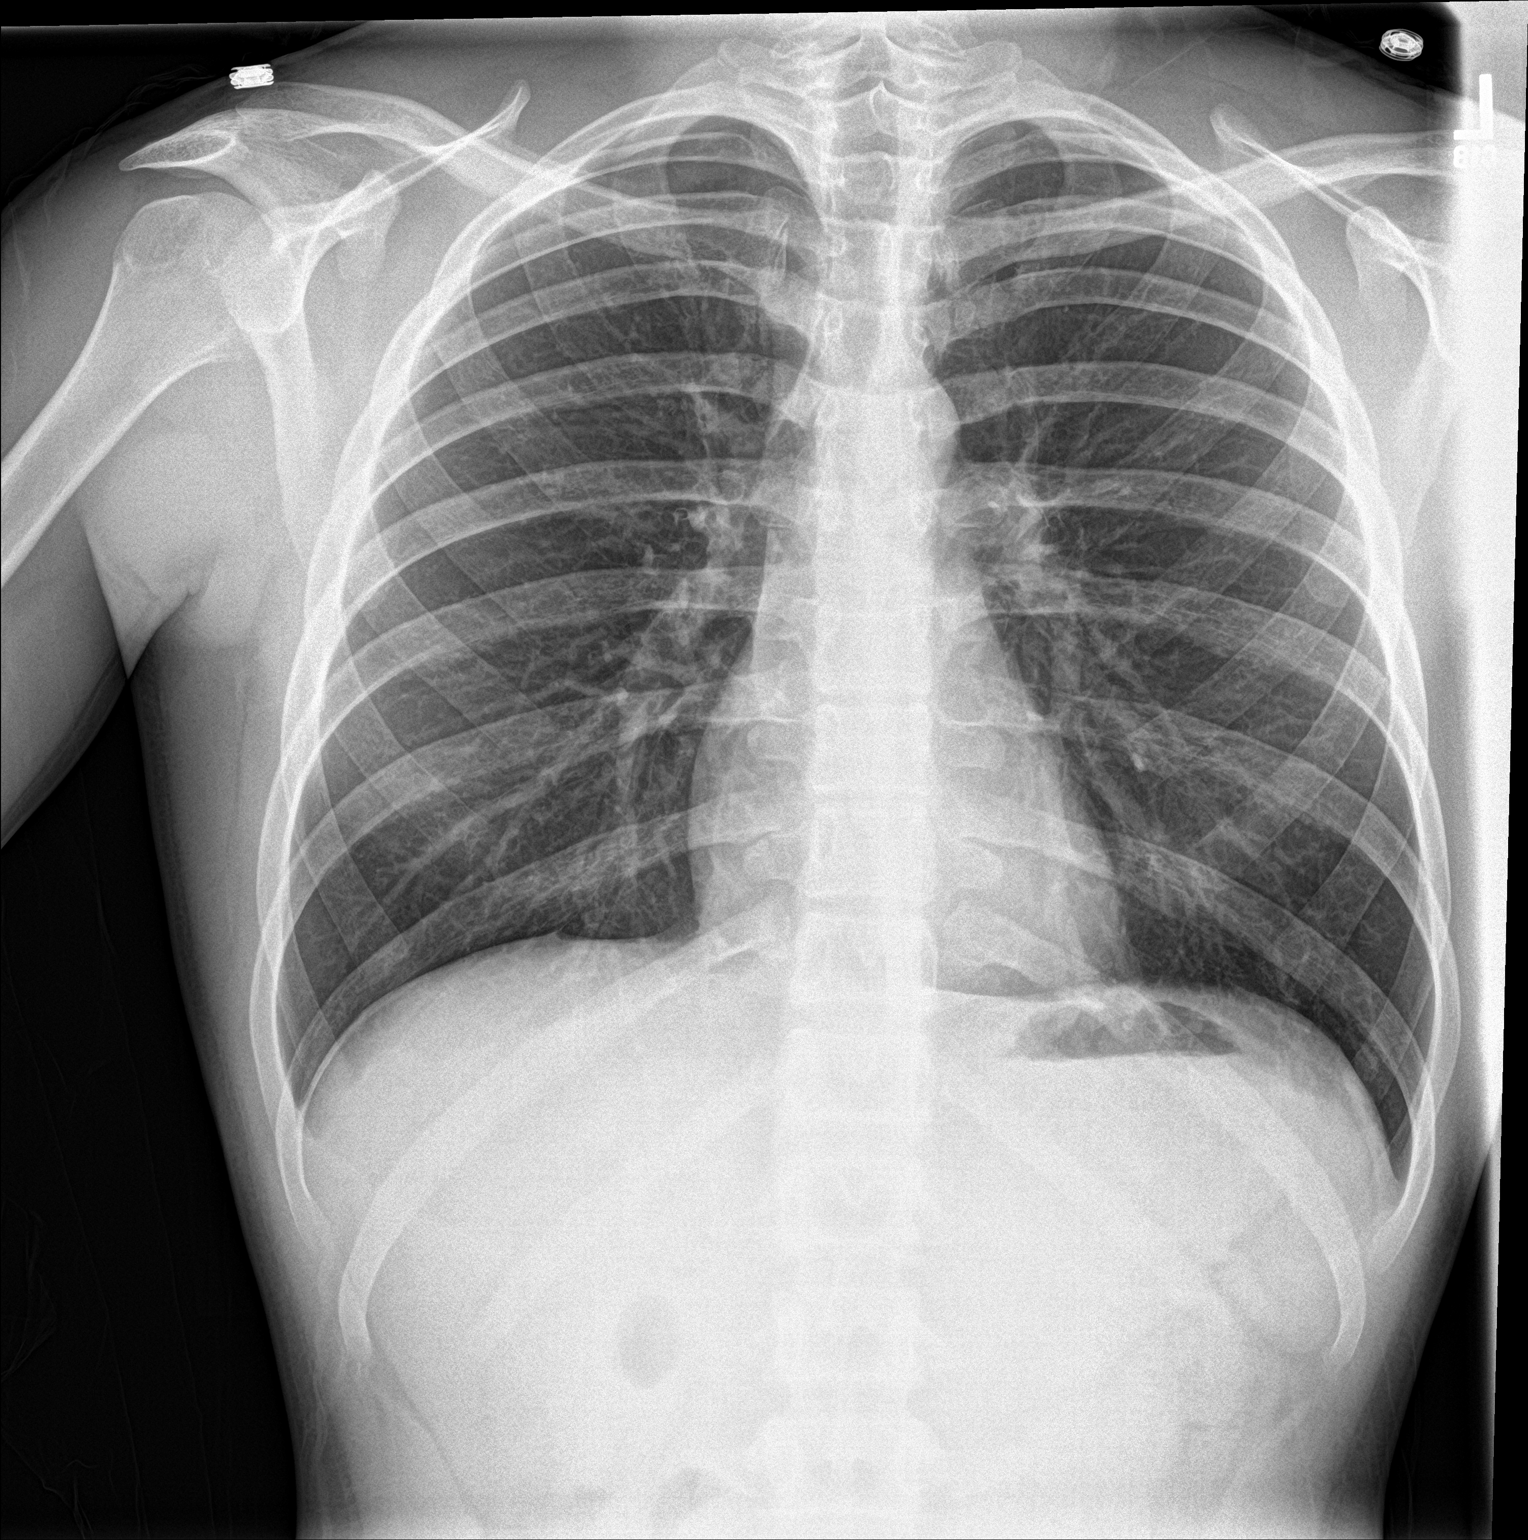

[chest lat]
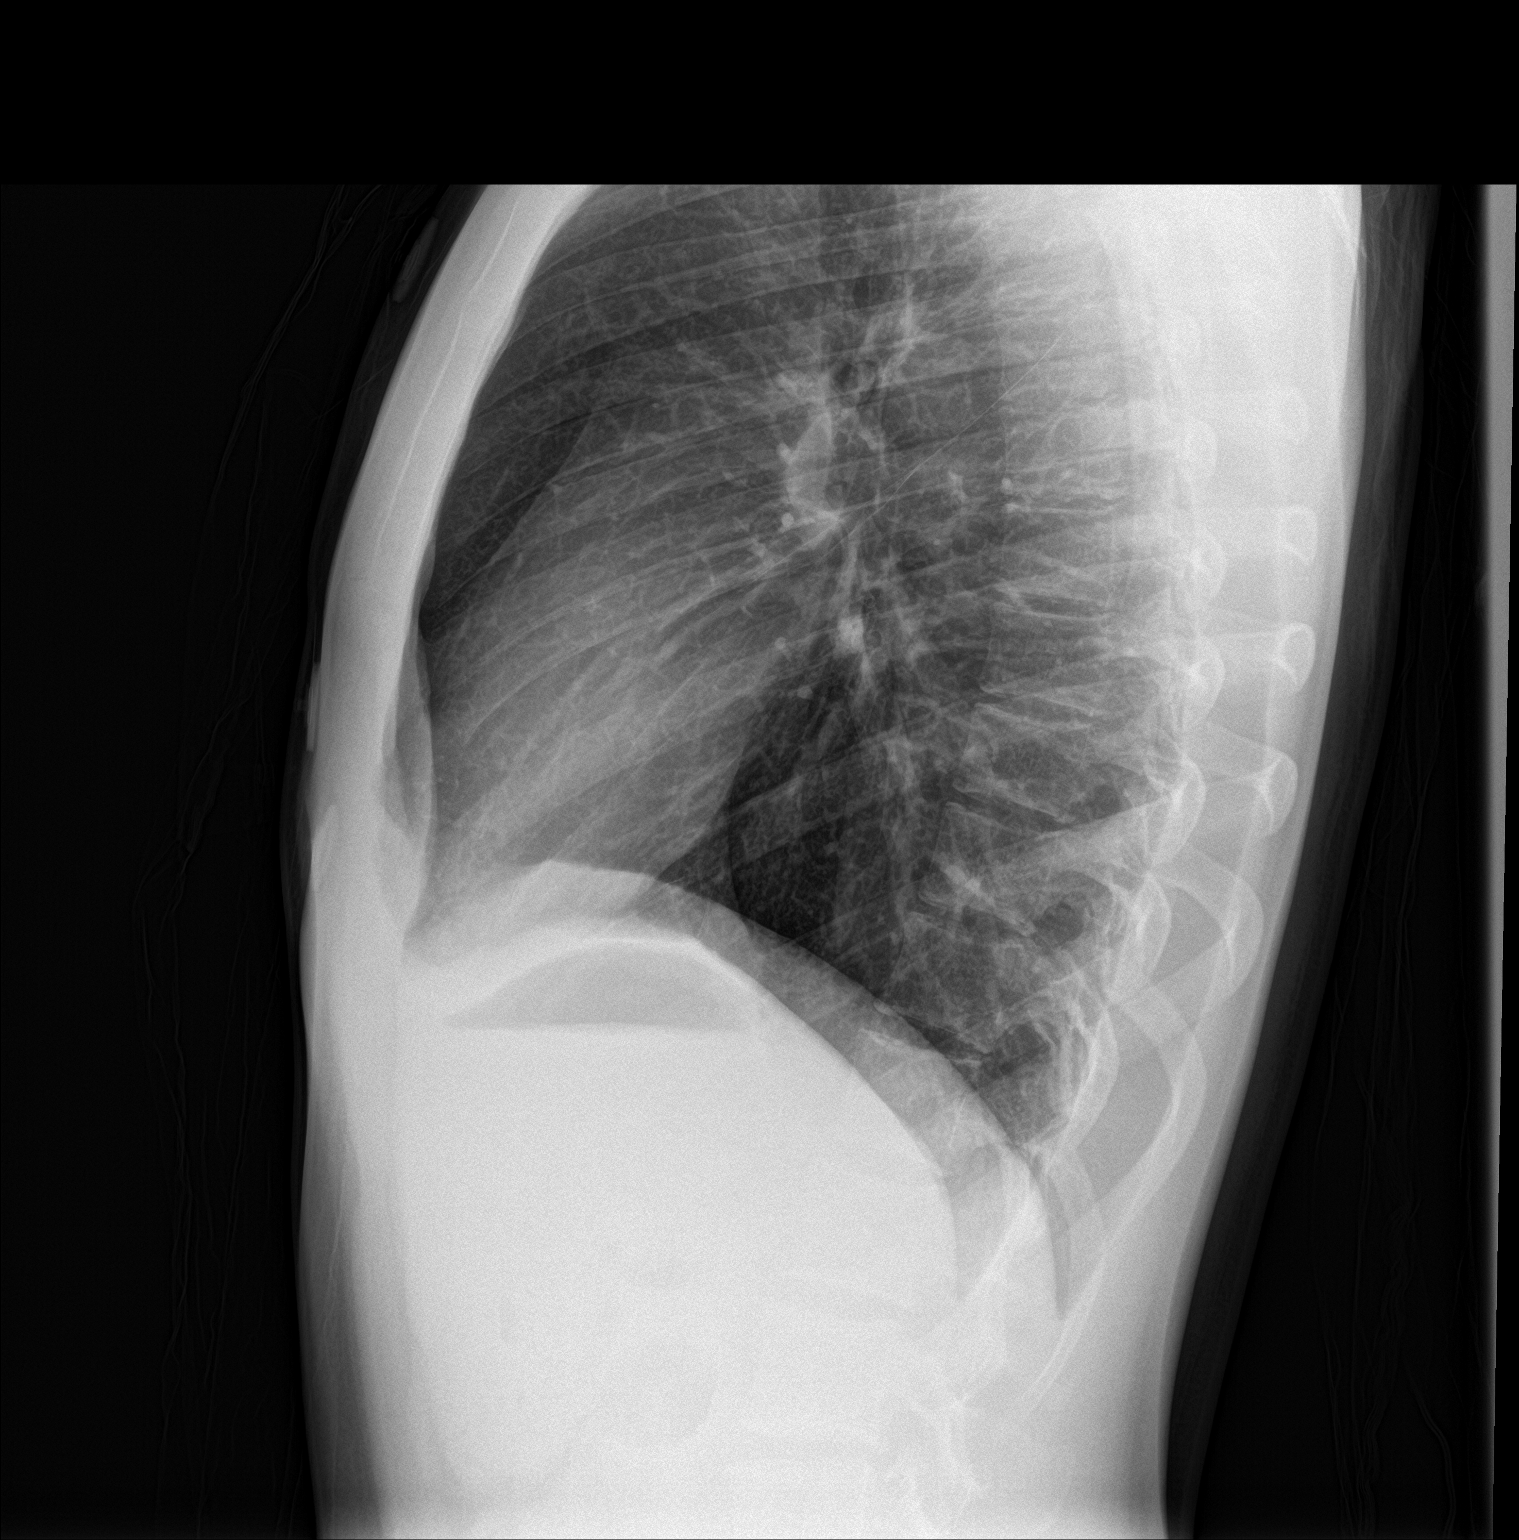

[2 of 2 positions shown; findings below may reference images not displayed]

FINDINGS: Lungs are clear. The heart size and pulmonary vascularity are
normal. No adenopathy. No pneumothorax. No bone lesions.
IMPRESSION: No edema or consolidation.

## 2020-12-10 ENCOUNTER — Ambulatory Visit: Payer: Medicaid Other | Admitting: Cardiology

## 2021-01-16 ENCOUNTER — Encounter: Payer: Self-pay | Admitting: Neurology

## 2021-04-18 ENCOUNTER — Ambulatory Visit: Payer: Medicaid Other | Admitting: Neurology

## 2023-06-08 ENCOUNTER — Ambulatory Visit
Admission: EM | Admit: 2023-06-08 | Discharge: 2023-06-08 | Disposition: A | Discharge status: Home / Self Care | Attending: Family Medicine | Admitting: Family Medicine

## 2023-06-08 DIAGNOSIS — R000 Tachycardia, unspecified: Secondary | ICD-10-CM

## 2023-06-08 DIAGNOSIS — R0602 Shortness of breath: Secondary | ICD-10-CM

## 2023-06-08 DIAGNOSIS — R079 Chest pain, unspecified: Secondary | ICD-10-CM

## 2023-06-08 LAB — POCT FASTING CBG KUC MANUAL ENTRY: POCT Glucose (KUC): 132 mg/dL — AB (ref 70–99)

## 2023-06-08 NOTE — ED Notes (Signed)
 Patient is being discharged from the Urgent Care and sent to the Emergency Department via POV . Per Rennis Chris, fnp, patient is in need of higher level of care due to sob, chest pain. Patient is aware and verbalizes understanding of plan of care.  Vitals:   06/08/23 1939  BP: 127/73  Pulse: (!) 115  Resp: (!) 24  Temp: (!) 97.5 F (36.4 C)  SpO2: 99%

## 2023-06-08 NOTE — ED Triage Notes (Signed)
 Pt states about 30 mins ago he began to feel very shaky, sob, numb, chest pain, and like his heart was racing.

## 2023-06-08 NOTE — ED Provider Notes (Signed)
 Patient presents to urgent care for evaluation of chest pain, shortness of breath, heart palpitations, and diaphoresis that started suddenly 30 minutes ago while he was driving. Chest pain is localized to the left of the sternum and is currently moderate. He feels like his heart is racing and he feels shaky. This "came out of nowhere". Additionally reports nausea without vomiting and states he feels like he "is going to pass out".  He went to work today and was fine. Recently ate slim jim's and drank orange juice prior to arrival, no history of diabetes.  History of asthma, states this does not feel like an asthma exacerbation. No recent cough/congestion. Denies history of anxiety at baseline and states he does not normally feel anxious.  Feels very anxious currently.   Lungs clear to auscultation bilaterally. Chest pain is not reproducible on exam.   Denies history of malignancy, calf pain/swelling. Never smoker. Low risk factors for PE/ACS, however I'd like for him to proceed to the nearest ER for further workup and evaluation to rule out acute/emergent cardiopulmonary cause of symptoms versus acute panic attack.   Vitals stable. EKG shows sinus tachycardia with pulmonary disease pattern per computer read. No ST/T wave changes, nonischemic. Patient is neurologically intact to baseline.   Discussed clinical concerns/exam findings leading to recommendation for further workup in the ER setting and risks of deferring ER visit with patient/family. Patient/family expresses understanding and agreement with plan, discharged to ER via private car.     Carlisle Beers, [place] 06/08/23 2125
